# Patient Record
Sex: Male | Born: 1970 | Race: Black or African American | Hispanic: No | Marital: Single | Smoking: Never smoker
Health system: Southern US, Community
[De-identification: ages and names within clinical notes are randomized; demographics above are authoritative.]

---

## 1998-06-11 ENCOUNTER — Encounter: Payer: Self-pay | Admitting: Emergency Medicine

## 1998-06-11 ENCOUNTER — Emergency Department (HOSPITAL_COMMUNITY): Admission: EM | Admit: 1998-06-11 | Discharge: 1998-06-11 | Payer: Self-pay | Admitting: Emergency Medicine

## 1998-06-18 ENCOUNTER — Emergency Department (HOSPITAL_COMMUNITY): Admission: EM | Admit: 1998-06-18 | Discharge: 1998-06-18 | Payer: Self-pay | Admitting: Emergency Medicine

## 1998-10-01 ENCOUNTER — Encounter: Payer: Self-pay | Admitting: Emergency Medicine

## 1998-10-01 ENCOUNTER — Emergency Department (HOSPITAL_COMMUNITY): Admission: EM | Admit: 1998-10-01 | Discharge: 1998-10-01 | Payer: Self-pay | Admitting: Emergency Medicine

## 1998-12-19 ENCOUNTER — Ambulatory Visit (HOSPITAL_COMMUNITY): Admission: RE | Admit: 1998-12-19 | Discharge: 1998-12-19 | Payer: Self-pay | Admitting: Neurosurgery

## 2000-04-08 ENCOUNTER — Emergency Department (HOSPITAL_COMMUNITY): Admission: EM | Admit: 2000-04-08 | Discharge: 2000-04-08 | Payer: Self-pay | Admitting: Emergency Medicine

## 2000-05-16 ENCOUNTER — Encounter: Payer: Self-pay | Admitting: Emergency Medicine

## 2000-05-16 ENCOUNTER — Emergency Department (HOSPITAL_COMMUNITY): Admission: EM | Admit: 2000-05-16 | Discharge: 2000-05-16 | Payer: Self-pay | Admitting: Emergency Medicine

## 2000-05-19 ENCOUNTER — Encounter: Admission: RE | Admit: 2000-05-19 | Discharge: 2000-05-19 | Payer: Self-pay | Admitting: Internal Medicine

## 2000-05-21 ENCOUNTER — Encounter: Payer: Self-pay | Admitting: *Deleted

## 2000-05-21 ENCOUNTER — Ambulatory Visit (HOSPITAL_COMMUNITY): Admission: RE | Admit: 2000-05-21 | Discharge: 2000-05-21 | Payer: Self-pay | Admitting: *Deleted

## 2001-08-14 ENCOUNTER — Emergency Department (HOSPITAL_COMMUNITY): Admission: EM | Admit: 2001-08-14 | Discharge: 2001-08-14 | Payer: Self-pay

## 2002-08-21 ENCOUNTER — Encounter: Payer: Self-pay | Admitting: Emergency Medicine

## 2002-08-21 ENCOUNTER — Emergency Department (HOSPITAL_COMMUNITY): Admission: EM | Admit: 2002-08-21 | Discharge: 2002-08-21 | Payer: Self-pay | Admitting: Emergency Medicine

## 2005-05-11 ENCOUNTER — Emergency Department (HOSPITAL_COMMUNITY): Admission: EM | Admit: 2005-05-11 | Discharge: 2005-05-11 | Payer: Self-pay | Admitting: Emergency Medicine

## 2006-01-14 ENCOUNTER — Emergency Department (HOSPITAL_COMMUNITY): Admission: EM | Admit: 2006-01-14 | Discharge: 2006-01-14 | Payer: Self-pay | Admitting: Emergency Medicine

## 2006-11-02 ENCOUNTER — Ambulatory Visit (HOSPITAL_BASED_OUTPATIENT_CLINIC_OR_DEPARTMENT_OTHER): Admission: RE | Admit: 2006-11-02 | Discharge: 2006-11-02 | Payer: Self-pay | Admitting: Orthopedic Surgery

## 2007-09-20 ENCOUNTER — Emergency Department (HOSPITAL_COMMUNITY): Admission: EM | Admit: 2007-09-20 | Discharge: 2007-09-20 | Payer: Self-pay | Admitting: Family Medicine

## 2008-02-07 IMAGING — CT CT ABDOMEN W/O CM
2 of 3 series · 17 of 46 positions shown, 19 images · non-contrast
Comparison: none

CLINICAL DATA: Right lower quadrant and right flank pain

ABDOMEN CT WITHOUT CONTRAST - URINARY STONE PROTOCOL
TECHNIQUE: Multidetector CT imaging of the abdomen was performed following the
urinary stone protocol.  No oral or intravenous contrast was administered.
TECHNIQUE: Multidetector CT imaging of the pelvis was performed following the

[Series 2: stone_wo 5.0 b40f st · axial · 0.74mm/px · z∈[-499,-175]mm · 14 of 94 slices shown, 16 images]
[im 7/94  soft-tissue]
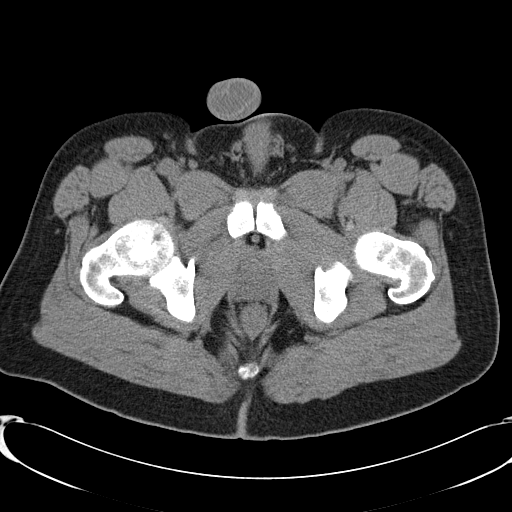
[im 7/94  bone]
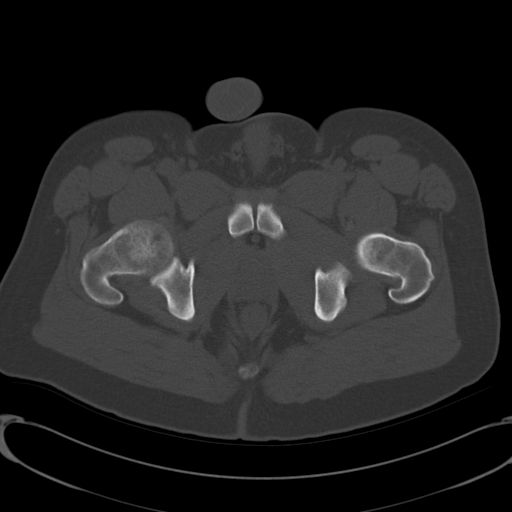
[im 13/94  soft-tissue]
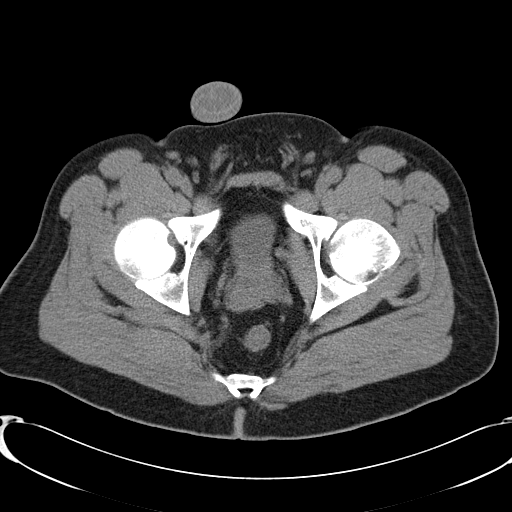
[im 19/94  soft-tissue]
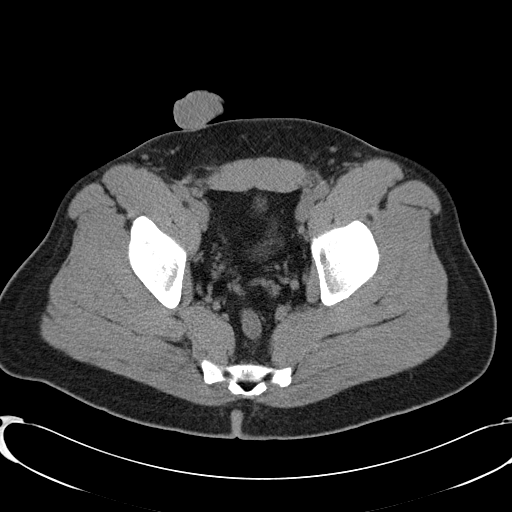
[im 25/94  soft-tissue]
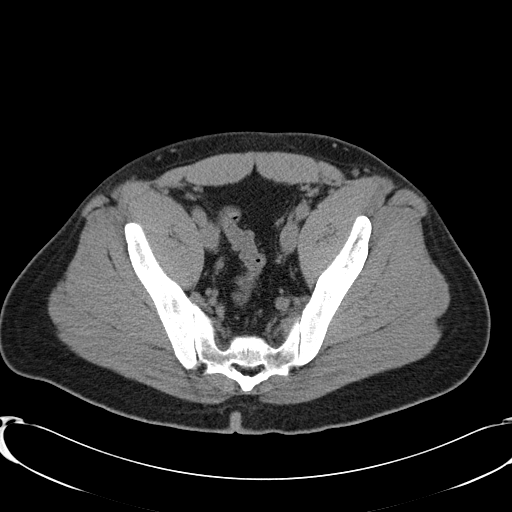
[im 31/94  soft-tissue]
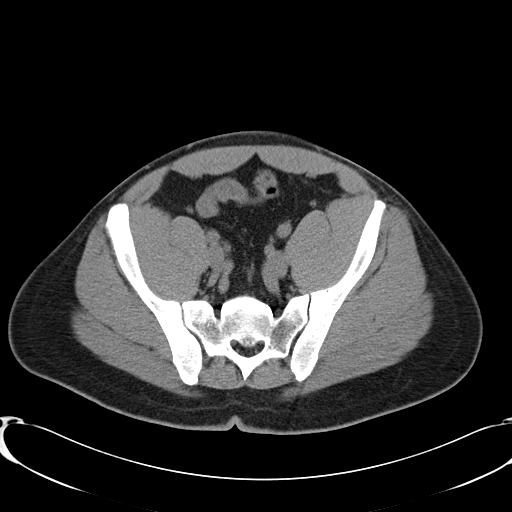
[im 37/94  soft-tissue]
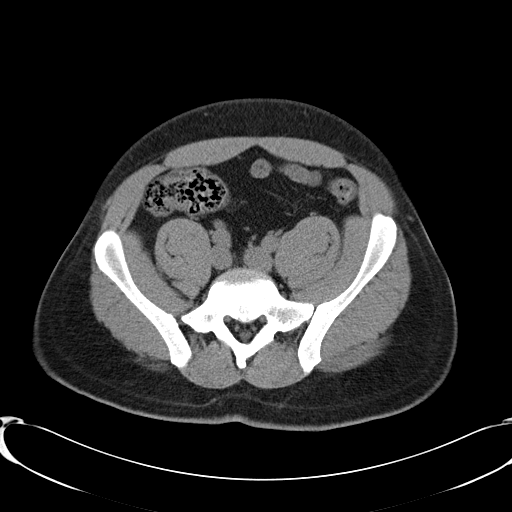
[im 43/94  soft-tissue]
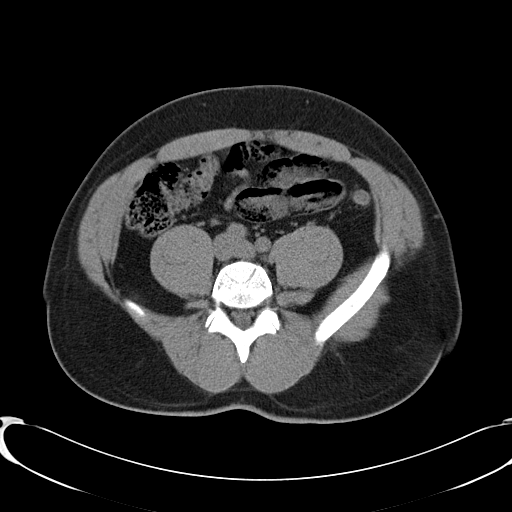
[im 52/94  soft-tissue]
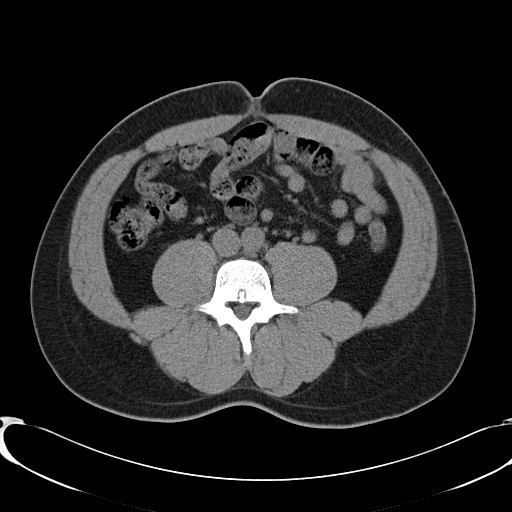
[im 58/94  soft-tissue]
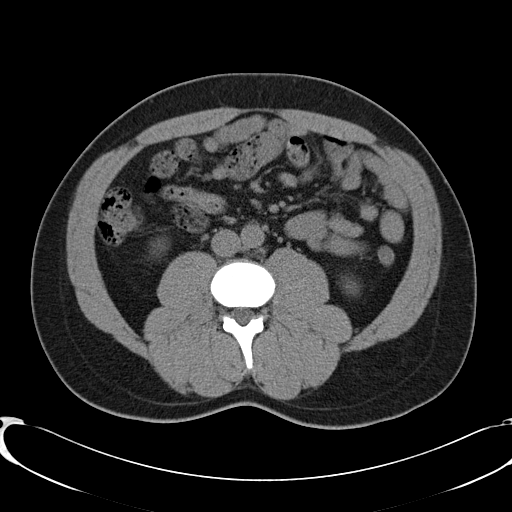
[im 58/94  bone]
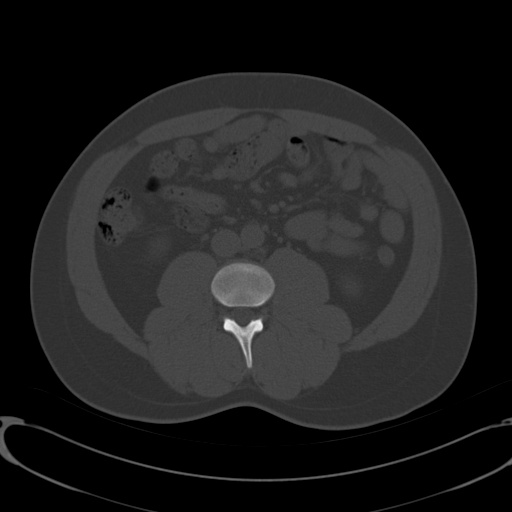
[im 64/94  soft-tissue]
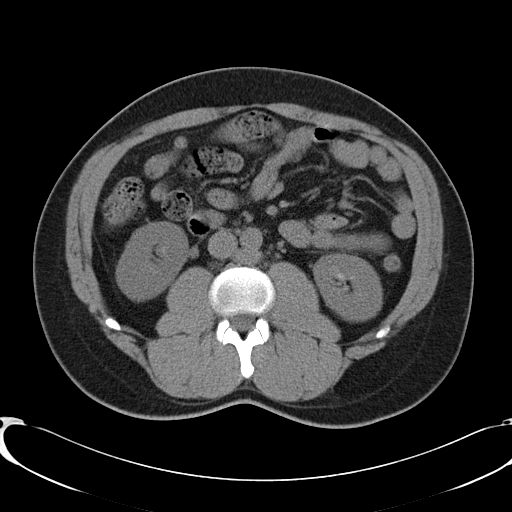
[im 70/94  soft-tissue]
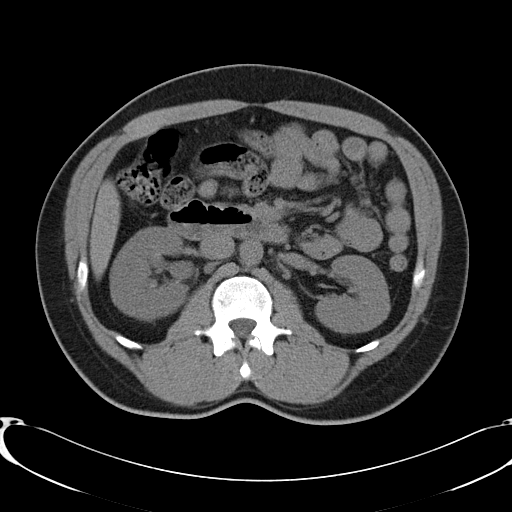
[im 76/94  soft-tissue]
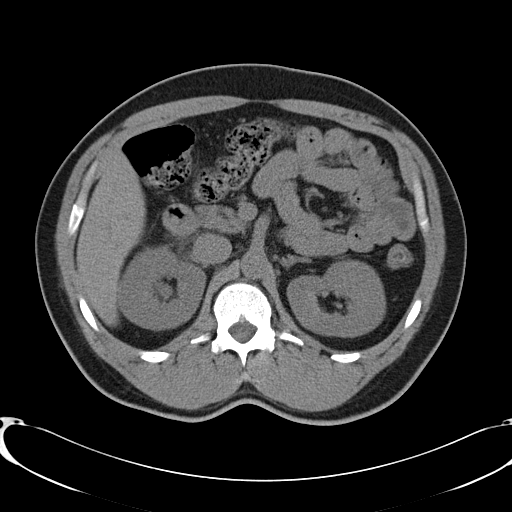
[im 82/94  soft-tissue]
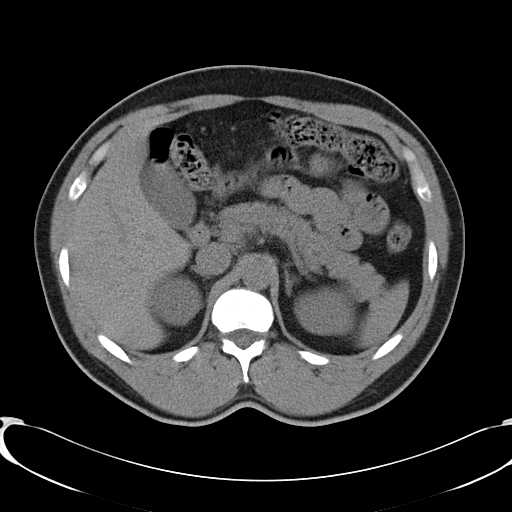
[im 88/94  soft-tissue]
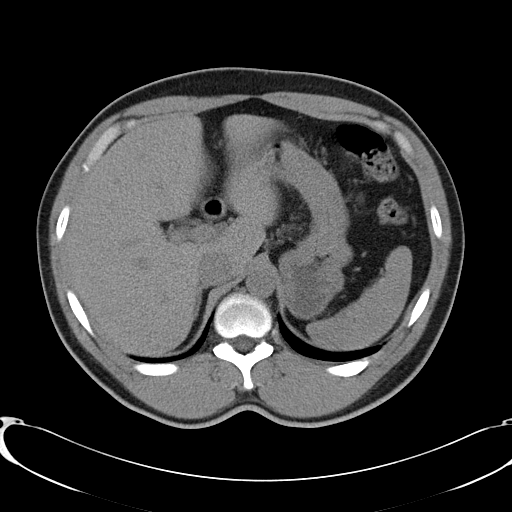

[Series 602: coronal · coronal · 0.77mm/px · 3 of 39 slices shown]
[im 13/39  soft-tissue]
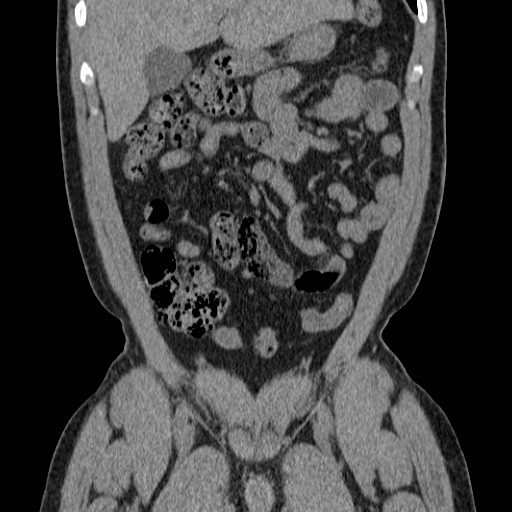
[im 17/39  soft-tissue]
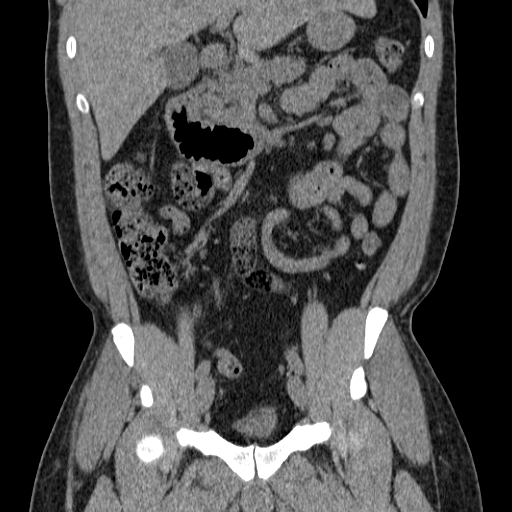
[im 22/39  soft-tissue]
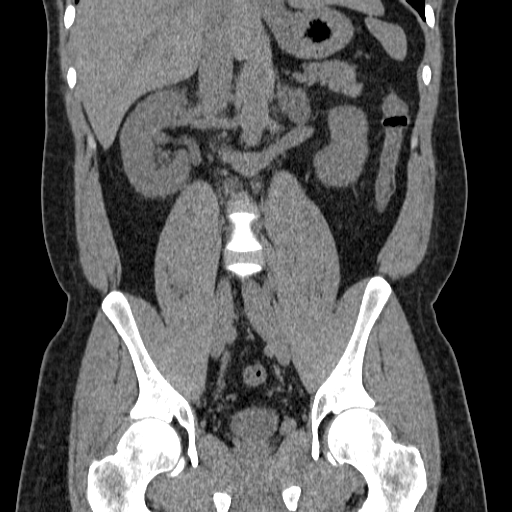

[17 of 46 positions shown; findings below may reference images not displayed]

FINDINGS: There is mild right hydroureteronephrosis. No renal or proximal
ureteral stones. There are vague low density areas within the right kidney
otherwise visualized solid organs and unremarkable uninfused appearance.
Gallbladder and bowel grossly unremarkable. No free fluid, free air, or
adenopathy there is mild constipation.

IMPRESSION

Mild right hydroureteronephrosis.

Two low density areas within the right kidney which may represent cysts, but
cannot be characterized without IV contrast.

PELVIS CT WITHOUT CONTRAST - URINARY STONE PROTOCOL
FINDINGS: There is a 3 mm stone at the right UVJ or possibly now just into the
bladder. The right ureter is mildly dilated to the right UVJ.

There is mild constipation. Appendix is normal. No free fluid, free air, or
adenopathy.

IMPRESSION

3 mm right UVJ stone, or possibly having just passed into the bladder.

Constipation

## 2010-11-21 NOTE — Op Note (Signed)
[name], [name]                 ACCOUNT NO.:  [id number]   MEDICAL RECORD NO.:  [id number]          PATIENT TYPE:  AMB   LOCATION:  DSC                          FACILITY:  [facility]   PHYSICIAN:  Cindee Salt, M.D.       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   OPERATION:  Repair nonunion with bone graft of serratus left middle  finger.   SURGEON:  Dr. Merlyn Lot   ASSISTANT:  RN.   DATE OF OPERATION:  November 02, 2006   HISTORY:  The patient is a 40 year old male who suffered a crush injury  to his left middle finger, fractured distal phalanx.  It has gone on to  nonunion in the midportion of the phalanx with free mobility to the  fracture fragment distally and continued pain.  He is desirous of having  this attempted to be repaired.  He is aware of risks and complications  including infection, nonunion, incomplete relief of symptoms, dystrophy,  injury to arteries, nerves, tendons.  The plan is for a distal radius  graft for autogenous bone grafting.  In the preoperative area, the  extremity is marked by both the patient and surgeon, antibiotics given,  questions encouraged and answered.   PROCEDURE:  The patient is brought to the operating room where a general  anesthetic was carried out without difficulty, was prepped using  DuraPrep, supine position, left arm free.  The limb was exsanguinated  with an Esmarch bandage, tourniquet placed high on the arm and was  inflated to 250 mmHg.  Mid lateral incision was made over the distal  phalanx, carried down through subcutaneous tissue.  The dissection  carried down to the phalanx.  This was opened.  Scar was removed with a  Beaver blade.  The neurovascular bundles were protected.  The  nonunion  site was then isolated with the freer elevator and sharp dissection.  This was confirmed with [facility] image intensification.  A House curette was  then used to roughen the bone proximally and distally.  The scar freed  from both dorsal palmar, radial, and ulnar aspects of the nonunion site.  Separate and transverse incision was then made over the distal radius,  carried down through subcutaneous tissue.  The radial sensory nerve was  identified, protected, retractors placed, dissection carried down to the  interval between the first and second extensor compartment.  The artery  in the compartment was maintained.  An incision was made in the  periosteum.  Drill holes were then placed with the 2.8 K-wire.  A bone  graft was then used after opening a window in the radius cortex large  enough to admit the large portion of the House curette.  Curettings were  then taken.  These were placed aside.  Two 2.8 K-wires were then placed  in the distal fracture fragment.  These were then used as joy sticks to  manipulate the fracture fragment.  The wound was irrigated.  Bone graft  was then packed and under image intensification, the fracture fragment  was reduced, and the 2 K-wires driven across into the proximal portion  of the distal phalanx, stabilizing it.  Neither pin crossed the joint  surface.  The reduction was found to be adequate, although not quite  complete with a very minimal dorsal angulation of the distal fragment.  This was felt to be acceptable.  The pins were bent, cut short.  The  wounds were then closed with interrupted 4-0 Vicryl Rapide sutures.  Sterile compressive dressing and splint to the finger  applied.  Sterile dressing and a volar wrist splint applied to the  wrist.  The patient tolerated the procedure well and was taken to the  recovery room for observation in satisfactory condition.  He is  discharged home to return to the [facility] [place] in 1 week   on Vicodin.           ______________________________  Cindee Salt, M.D.     GK/MEDQ  D:  11/02/2006  T:  11/09/2006  Job:  [id number]

## 2019-04-10 ENCOUNTER — Other Ambulatory Visit: Payer: Self-pay | Admitting: *Deleted

## 2019-04-10 DIAGNOSIS — Z20822 Contact with and (suspected) exposure to covid-19: Secondary | ICD-10-CM

## 2019-04-11 LAB — NOVEL CORONAVIRUS, NAA: SARS-CoV-2, NAA: NOT DETECTED

## 2020-10-26 ENCOUNTER — Other Ambulatory Visit: Payer: Self-pay

## 2020-10-26 ENCOUNTER — Emergency Department (HOSPITAL_BASED_OUTPATIENT_CLINIC_OR_DEPARTMENT_OTHER): Payer: 59

## 2020-10-26 ENCOUNTER — Encounter (HOSPITAL_BASED_OUTPATIENT_CLINIC_OR_DEPARTMENT_OTHER): Payer: Self-pay | Admitting: Emergency Medicine

## 2020-10-26 ENCOUNTER — Emergency Department (HOSPITAL_BASED_OUTPATIENT_CLINIC_OR_DEPARTMENT_OTHER)
Admission: EM | Admit: 2020-10-26 | Discharge: 2020-10-26 | Disposition: A | Discharge status: Home / Self Care | Payer: 59 | Attending: Emergency Medicine | Admitting: Emergency Medicine

## 2020-10-26 DIAGNOSIS — R079 Chest pain, unspecified: Secondary | ICD-10-CM | POA: Diagnosis present

## 2020-10-26 LAB — CBC WITH DIFFERENTIAL/PLATELET
Abs Immature Granulocytes: 0.03 10*3/uL (ref 0.00–0.07)
Basophils Absolute: 0 10*3/uL (ref 0.0–0.1)
Basophils Relative: 0 %
Eosinophils Absolute: 0.1 10*3/uL (ref 0.0–0.5)
Eosinophils Relative: 2 %
HCT: 41.6 % (ref 39.0–52.0)
Hemoglobin: 13.7 g/dL (ref 13.0–17.0)
Immature Granulocytes: 1 %
Lymphocytes Relative: 28 %
Lymphs Abs: 1.8 10*3/uL (ref 0.7–4.0)
MCH: 29.3 pg (ref 26.0–34.0)
MCHC: 32.9 g/dL (ref 30.0–36.0)
MCV: 89.1 fL (ref 80.0–100.0)
Monocytes Absolute: 0.6 10*3/uL (ref 0.1–1.0)
Monocytes Relative: 9 %
Neutro Abs: 3.9 10*3/uL (ref 1.7–7.7)
Neutrophils Relative %: 60 %
Platelets: 279 10*3/uL (ref 150–400)
RBC: 4.67 MIL/uL (ref 4.22–5.81)
RDW: 14.9 % (ref 11.5–15.5)
WBC: 6.5 10*3/uL (ref 4.0–10.5)
nRBC: 0 % (ref 0.0–0.2)

## 2020-10-26 LAB — TROPONIN I (HIGH SENSITIVITY)
Troponin I (High Sensitivity): 2 ng/L (ref <18)
Troponin I (High Sensitivity): <2 ng/L (ref <18)

## 2020-10-26 LAB — COMPREHENSIVE METABOLIC PANEL
ALT: 34 U/L (ref 0–44)
AST: 24 U/L (ref 15–41)
Albumin: 3.6 g/dL (ref 3.5–5.0)
Alkaline Phosphatase: 67 U/L (ref 38–126)
Anion gap: 8 (ref 5–15)
BUN: 14 mg/dL (ref 6–20)
CO2: 25 mmol/L (ref 22–32)
Calcium: 8.9 mg/dL (ref 8.9–10.3)
Chloride: 106 mmol/L (ref 98–111)
Creatinine, Ser: 0.87 mg/dL (ref 0.61–1.24)
GFR, Estimated: >60 mL/min (ref >60)
Glucose, Bld: 117 mg/dL — ABNORMAL HIGH (ref 70–99)
Potassium: 4.2 mmol/L (ref 3.5–5.1)
Sodium: 139 mmol/L (ref 135–145)
Total Bilirubin: 0.3 mg/dL (ref 0.3–1.2)
Total Protein: 7.2 g/dL (ref 6.5–8.1)

## 2020-10-26 NOTE — Discharge Instructions (Signed)
Like we discussed, you can start taking Pepcid once a day for the next 2 weeks.  See if this helps improve your symptoms.  Also make sure you are watching your diet.  I have attached some foods that are typically easy to digest and might help with your symptoms.  Continue to monitor your symptoms closely.  If they worsen, you need to return to the emergency department to be reevaluated.  Otherwise, please follow-up with your regular doctor.  It was a pleasure to meet you both.

## 2020-10-26 NOTE — ED Triage Notes (Addendum)
States, " I have been having chest pain x 3 months" Drive's trucks long distance, pain off and on. Pain worse after eating,describes like feeling like something is struck in his chest. Denies pain at present.

## 2020-10-26 NOTE — ED Provider Notes (Signed)
[facility] EMERGENCY DEPARTMENT Provider Note   CSN: [id number] Arrival date & time: 10/26/20  1114     History Chief Complaint  Patient presents with  . Chest Pain    [name] is a 50 y.o. male.  HPI   Patient is a 50 year old male who presents the emergency department due to intermittent chest pain for the past 3 months.  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  States his pain will occur on a nearly daily basis.  States it is typically in the left side of the chest and is sharp.  7/10 at its worst.  Also notes that his pain will sometimes be in the center part of his chest  "and he feels as if there is something stuck in the middle of his chest."  He states that this will sometimes make him feel nauseous and want to vomit with some associated SOB. Eating typically exacerbates his symptoms.  Also states that drinking fluids will help "smooth it out" and alleviate his symptoms.  No new leg swelling, hemoptysis, history of blood clots.  His significant other reached out to his PCP today and they recommended that he come to the emergency department for further evaluation. States about 2 to 3 hours ago he experienced a short bout of chest pain which resolved spontaneously.  Denies any current symptoms.     History reviewed. No pertinent past medical history.  There are no problems to display for this patient.   Past Surgical History:  Procedure Laterality Date  . HAND SURGERY    . LIPOMA EXCISION         No family history on file.  Social History   Tobacco Use  . Smoking status: Never Smoker  . Smokeless tobacco: Never Used  Substance Use Topics  . Alcohol use: Yes    Comment: social    Home Medications Prior to Admission medications   Not on File    Allergies    Patient has no known allergies.  Review of Systems   Review of Systems  All other systems reviewed and are negative. Ten systems reviewed and are negative  for acute change, except as noted in the HPI.   Physical Exam Updated Vital Signs BP 110/74   Pulse 78   Temp 97.9 F (36.6 C) (Oral)   Resp 16   Ht 5\' 8"  (1.727 m)   Wt 120.1 kg   SpO2 99%   BMI 40.25 kg/m   Physical Exam Vitals and nursing note reviewed.  Constitutional:      General: He is not in acute distress.    Appearance: Normal appearance. He is well-developed. He is obese. He is not ill-appearing, toxic-appearing or diaphoretic.  HENT:     Head: Normocephalic and atraumatic.     Right Ear: External ear normal.     Left Ear: External ear normal.     Nose: Nose normal.     Mouth/Throat:     Mouth: Mucous membranes are moist.     Pharynx: Oropharynx is clear. No oropharyngeal exudate or posterior oropharyngeal erythema.  Eyes:     Extraocular Movements: Extraocular movements intact.  Cardiovascular:     Rate and Rhythm: Normal rate and regular rhythm.  No extrasystoles are present.    Pulses: Normal pulses.          Radial pulses are 2+ on the right side and 2+ on the left side.  Dorsalis pedis pulses are 2+ on the right side and 2+ on the left side.     Heart sounds: Normal heart sounds. Heart sounds not distant. No murmur heard.  No systolic murmur is present.  No diastolic murmur is present. No friction rub. No gallop. No S3 or S4 sounds.      Comments: Regular rate and rhythm without murmurs, rubs, or gallops. Pulmonary:     Effort: Pulmonary effort is normal. No tachypnea, accessory muscle usage or respiratory distress.     Breath sounds: Normal breath sounds. No stridor. No decreased breath sounds, wheezing, rhonchi or rales.  Abdominal:     General: Abdomen is flat.     Palpations: Abdomen is soft.     Tenderness: There is no abdominal tenderness.  Musculoskeletal:        General: Normal range of motion.     Cervical back: Normal range of motion and neck supple. No tenderness.     Right lower leg: No edema.     Left lower leg: No edema.      Comments: No pedal edema.  Skin:    General: Skin is warm and dry.  Neurological:     General: No focal deficit present.     Mental Status: He is alert and oriented to person, place, and time.  Psychiatric:        Mood and Affect: Mood normal.        Behavior: Behavior normal.    ED Results / Procedures / Treatments   Labs (all labs ordered are listed, but only abnormal results are displayed) Labs Reviewed  COMPREHENSIVE METABOLIC PANEL - Abnormal; Notable for the following components:      Result Value   Glucose, Bld 117 (*)    All other components within normal limits  CBC WITH DIFFERENTIAL/PLATELET  TROPONIN I (HIGH SENSITIVITY)  TROPONIN I (HIGH SENSITIVITY)    EKG EKG Interpretation  Date/Time:  Saturday October 26 2020 11:23:26 EDT Ventricular Rate:  78 PR Interval:  128 QRS Duration: 97 QT Interval:  379 QTC Calculation: 432 R Axis:   81 Text Interpretation: Sinus rhythm Low voltage, extremity leads No significant change since last tracing Confirmed by Gwyneth Sprout ([id number]) on 10/26/2020 12:08:55 PM   Radiology DG Chest Portable 1 View  Result Date: 10/26/2020 CLINICAL DATA:  Chest pain. EXAM: PORTABLE CHEST 1 VIEW COMPARISON:  None. FINDINGS: The heart size and mediastinal contours are within normal limits. Both lungs are clear. The visualized skeletal structures are unremarkable. IMPRESSION: No active disease. Electronically Signed   By: Gerome Sam III M.D   On: 10/26/2020 12:26    Procedures Procedures   Medications Ordered in ED Medications - No data to display  ED Course  I have reviewed the triage vital signs and the nursing notes.  Pertinent labs & imaging results that were available during my care of the patient were reviewed by me and considered in my medical decision making (see chart for details).    MDM Rules/Calculators/A&P                          Pt is a 50 y.o. male who presents the emergency department due to intermittent chest  pain for the past 3 months.  Labs: CBC with differential without abnormalities. CMP with a glucose of 117. Troponin of 2 with a repeat less than 2.  Imaging: Chest x-ray is negative.  ECG: Normal sinus rhythm which shows low voltage.  I, Placido Sou, PA-C, personally reviewed and evaluated these images and lab results as part of my medical decision-making.  Unsure of the cause of patient's chest pain.  Wells criteria is 0.  Heart score of 2.  Reassuring troponins, ECG, chest x-ray.  Doubt ACS or DVT/PE at this time.  Patient states his symptoms seem to start after eating and do alleviate when drinking fluids.  They could possibly be secondary to reflux.  Discussed a trial of Pepcid and patient was amenable.  He is going to try this for 2 to 3 weeks and follow-up with his regular doctor regarding his symptoms.   He understands that if his symptoms worsen that he should return to the emergency department immediately for reevaluation.  Feel that he is stable for discharge at this time and he is agreeable.  His questions were answered and he was amicable at the time of discharge.  Note: Portions of this report may have been transcribed using voice recognition software. Every effort was made to ensure accuracy; however, inadvertent computerized transcription errors may be present.   Final Clinical Impression(s) / ED Diagnoses Final diagnoses:  Chest pain, unspecified type    Rx / DC Orders ED Discharge Orders    None       Placido Sou, PA-C 10/26/20 1816    Gwyneth Sprout, MD 10/27/20 1020

## 2020-10-26 NOTE — ED Notes (Signed)
EKG done and given to ED MD

## 2022-05-13 ENCOUNTER — Encounter (HOSPITAL_BASED_OUTPATIENT_CLINIC_OR_DEPARTMENT_OTHER): Payer: Self-pay | Admitting: Emergency Medicine

## 2022-05-13 ENCOUNTER — Emergency Department (HOSPITAL_BASED_OUTPATIENT_CLINIC_OR_DEPARTMENT_OTHER): Payer: BC Managed Care – PPO | Admitting: Radiology

## 2022-05-13 ENCOUNTER — Emergency Department (HOSPITAL_BASED_OUTPATIENT_CLINIC_OR_DEPARTMENT_OTHER)
Admission: EM | Admit: 2022-05-13 | Discharge: 2022-05-13 | Discharge status: Against Medical Advice | Payer: BC Managed Care – PPO | Attending: Emergency Medicine | Admitting: Emergency Medicine

## 2022-05-13 ENCOUNTER — Other Ambulatory Visit: Payer: Self-pay

## 2022-05-13 DIAGNOSIS — Z1152 Encounter for screening for COVID-19: Secondary | ICD-10-CM | POA: Insufficient documentation

## 2022-05-13 DIAGNOSIS — R059 Cough, unspecified: Secondary | ICD-10-CM | POA: Insufficient documentation

## 2022-05-13 DIAGNOSIS — R0602 Shortness of breath: Secondary | ICD-10-CM | POA: Insufficient documentation

## 2022-05-13 DIAGNOSIS — Z5321 Procedure and treatment not carried out due to patient leaving prior to being seen by health care provider: Secondary | ICD-10-CM | POA: Diagnosis not present

## 2022-05-13 LAB — RESP PANEL BY RT-PCR (FLU A&B, COVID) ARPGX2
Influenza A by PCR: NEGATIVE
Influenza B by PCR: NEGATIVE
SARS Coronavirus 2 by RT PCR: NEGATIVE

## 2022-05-13 LAB — BASIC METABOLIC PANEL
Anion gap: 11 (ref 5–15)
BUN: 17 mg/dL (ref 6–20)
CO2: 21 mmol/L — ABNORMAL LOW (ref 22–32)
Calcium: 9.3 mg/dL (ref 8.9–10.3)
Chloride: 107 mmol/L (ref 98–111)
Creatinine, Ser: 0.77 mg/dL (ref 0.61–1.24)
GFR, Estimated: >60 mL/min (ref >60)
Glucose, Bld: 174 mg/dL — ABNORMAL HIGH (ref 70–99)
Potassium: 3.8 mmol/L (ref 3.5–5.1)
Sodium: 139 mmol/L (ref 135–145)

## 2022-05-13 LAB — CBC
HCT: 42.1 % (ref 39.0–52.0)
Hemoglobin: 13.8 g/dL (ref 13.0–17.0)
MCH: 29 pg (ref 26.0–34.0)
MCHC: 32.8 g/dL (ref 30.0–36.0)
MCV: 88.4 fL (ref 80.0–100.0)
Platelets: 280 10*3/uL (ref 150–400)
RBC: 4.76 MIL/uL (ref 4.22–5.81)
RDW: 14.9 % (ref 11.5–15.5)
WBC: 7.9 10*3/uL (ref 4.0–10.5)
nRBC: 0 % (ref 0.0–0.2)

## 2022-05-13 LAB — TROPONIN I (HIGH SENSITIVITY): Troponin I (High Sensitivity): <2 ng/L (ref <18)

## 2022-05-13 MED ORDER — ALBUTEROL SULFATE HFA 108 (90 BASE) MCG/ACT IN AERS: 2.0000 | INHALATION_SPRAY | RESPIRATORY_TRACT | Status: DC | PRN

## 2022-05-13 NOTE — ED Triage Notes (Signed)
Started with a cold about 1 week ago. 1 week ago felt a "pop" in the left side rib area. Was seen on Monday at [facility] given toradol for pain. No relief. Tender to touch and worse when coughing/sneezing Sob now, painful to take a deeep breath
